# Patient Record
Sex: Female | Born: 1990 | Race: White | Hispanic: No | Marital: Single | Smoking: Never smoker
Health system: Southern US, Community
[De-identification: ages and names within clinical notes are randomized; demographics above are authoritative.]

## PROBLEM LIST (undated history)

## (undated) DIAGNOSIS — R030 Elevated blood-pressure reading, without diagnosis of hypertension: Secondary | ICD-10-CM

## (undated) DIAGNOSIS — IMO0001 Reserved for inherently not codable concepts without codable children: Secondary | ICD-10-CM

## (undated) HISTORY — DX: Elevated blood-pressure reading, without diagnosis of hypertension: R03.0

## (undated) HISTORY — DX: Reserved for inherently not codable concepts without codable children: IMO0001

---
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
## 2013-10-06 ENCOUNTER — Encounter: Payer: Self-pay | Admitting: *Deleted

## 2017-07-03 ENCOUNTER — Ambulatory Visit (INDEPENDENT_AMBULATORY_CARE_PROVIDER_SITE_OTHER): Payer: BLUE CROSS/BLUE SHIELD | Admitting: Family Medicine

## 2017-07-03 ENCOUNTER — Encounter: Payer: Self-pay | Admitting: Family Medicine

## 2017-07-03 VITALS — BP 152/110 | Temp 98.4°F | Ht 63.0 in | Wt 340.0 lb

## 2017-07-03 DIAGNOSIS — R030 Elevated blood-pressure reading, without diagnosis of hypertension: Secondary | ICD-10-CM | POA: Diagnosis not present

## 2017-07-03 DIAGNOSIS — R233 Spontaneous ecchymoses: Secondary | ICD-10-CM | POA: Diagnosis not present

## 2017-07-03 DIAGNOSIS — A084 Viral intestinal infection, unspecified: Secondary | ICD-10-CM

## 2017-07-03 DIAGNOSIS — R112 Nausea with vomiting, unspecified: Secondary | ICD-10-CM

## 2017-07-03 MED ORDER — ONDANSETRON HCL 8 MG PO TABS: 8.0000 mg | ORAL_TABLET | Freq: Three times a day (TID) | ORAL | 2 refills | Status: DC | PRN

## 2017-07-03 NOTE — Patient Instructions (Signed)
DASH Eating Plan DASH stands for "Dietary Approaches to Stop Hypertension." The DASH eating plan is a healthy eating plan that has been shown to reduce high blood pressure (hypertension). It may also reduce your risk for type 2 diabetes, heart disease, and stroke. The DASH eating plan may also help with weight loss. What are tips for following this plan? General guidelines  Avoid eating more than 2,300 mg (milligrams) of salt (sodium) a day. If you have hypertension, you may need to reduce your sodium intake to 1,500 mg a day.  Limit alcohol intake to no more than 1 drink a day for nonpregnant women and 2 drinks a day for men. One drink equals 12 oz of beer, 5 oz of wine, or 1 oz of hard liquor.  Work with your health care provider to maintain a healthy body weight or to lose weight. Ask what an ideal weight is for you.  Get at least 30 minutes of exercise that causes your heart to beat faster (aerobic exercise) most days of the week. Activities may include walking, swimming, or biking.  Work with your health care provider or diet and nutrition specialist (dietitian) to adjust your eating plan to your individual calorie needs. Reading food labels  Check food labels for the amount of sodium per serving. Choose foods with less than 5 percent of the Daily Value of sodium. Generally, foods with less than 300 mg of sodium per serving fit into this eating plan.  To find whole grains, look for the word "whole" as the first word in the ingredient list. Shopping  Buy products labeled as "low-sodium" or "no salt added."  Buy fresh foods. Avoid canned foods and premade or frozen meals. Cooking  Avoid adding salt when cooking. Use salt-free seasonings or herbs instead of table salt or sea salt. Check with your health care provider or pharmacist before using salt substitutes.  Do not fry foods. Cook foods using healthy methods such as baking, boiling, grilling, and broiling instead.  Cook with  heart-healthy oils, such as olive, canola, soybean, or sunflower oil. Meal planning   Eat a balanced diet that includes: ? 5 or more servings of fruits and vegetables each day. At each meal, try to fill half of your plate with fruits and vegetables. ? Up to 6-8 servings of whole grains each day. ? Less than 6 oz of lean meat, poultry, or fish each day. A 3-oz serving of meat is about the same size as a deck of cards. One egg equals 1 oz. ? 2 servings of low-fat dairy each day. ? A serving of nuts, seeds, or beans 5 times each week. ? Heart-healthy fats. Healthy fats called Omega-3 fatty acids are found in foods such as flaxseeds and coldwater fish, like sardines, salmon, and mackerel.  Limit how much you eat of the following: ? Canned or prepackaged foods. ? Food that is high in trans fat, such as fried foods. ? Food that is high in saturated fat, such as fatty meat. ? Sweets, desserts, sugary drinks, and other foods with added sugar. ? Full-fat dairy products.  Do not salt foods before eating.  Try to eat at least 2 vegetarian meals each week.  Eat more home-cooked food and less restaurant, buffet, and fast food.  When eating at a restaurant, ask that your food be prepared with less salt or no salt, if possible. What foods are recommended? The items listed may not be a complete list. Talk with your dietitian about what   dietary choices are best for you. Grains Whole-grain or whole-wheat bread. Whole-grain or whole-wheat pasta. Brown rice. Oatmeal. Quinoa. Bulgur. Whole-grain and low-sodium cereals. Pita bread. Low-fat, low-sodium crackers. Whole-wheat flour tortillas. Vegetables Fresh or frozen vegetables (raw, steamed, roasted, or grilled). Low-sodium or reduced-sodium tomato and vegetable juice. Low-sodium or reduced-sodium tomato sauce and tomato paste. Low-sodium or reduced-sodium canned vegetables. Fruits All fresh, dried, or frozen fruit. Canned fruit in natural juice (without  added sugar). Meat and other protein foods Skinless chicken or turkey. Ground chicken or turkey. Pork with fat trimmed off. Fish and seafood. Egg whites. Dried beans, peas, or lentils. Unsalted nuts, nut butters, and seeds. Unsalted canned beans. Lean cuts of beef with fat trimmed off. Low-sodium, lean deli meat. Dairy Low-fat (1%) or fat-free (skim) milk. Fat-free, low-fat, or reduced-fat cheeses. Nonfat, low-sodium ricotta or cottage cheese. Low-fat or nonfat yogurt. Low-fat, low-sodium cheese. Fats and oils Soft margarine without trans fats. Vegetable oil. Low-fat, reduced-fat, or light mayonnaise and salad dressings (reduced-sodium). Canola, safflower, olive, soybean, and sunflower oils. Avocado. Seasoning and other foods Herbs. Spices. Seasoning mixes without salt. Unsalted popcorn and pretzels. Fat-free sweets. What foods are not recommended? The items listed may not be a complete list. Talk with your dietitian about what dietary choices are best for you. Grains Baked goods made with fat, such as croissants, muffins, or some breads. Dry pasta or rice meal packs. Vegetables Creamed or fried vegetables. Vegetables in a cheese sauce. Regular canned vegetables (not low-sodium or reduced-sodium). Regular canned tomato sauce and paste (not low-sodium or reduced-sodium). Regular tomato and vegetable juice (not low-sodium or reduced-sodium). Pickles. Olives. Fruits Canned fruit in a light or heavy syrup. Fried fruit. Fruit in cream or butter sauce. Meat and other protein foods Fatty cuts of meat. Ribs. Fried meat. Bacon. Sausage. Bologna and other processed lunch meats. Salami. Fatback. Hotdogs. Bratwurst. Salted nuts and seeds. Canned beans with added salt. Canned or smoked fish. Whole eggs or egg yolks. Chicken or turkey with skin. Dairy Whole or 2% milk, cream, and half-and-half. Whole or full-fat cream cheese. Whole-fat or sweetened yogurt. Full-fat cheese. Nondairy creamers. Whipped toppings.  Processed cheese and cheese spreads. Fats and oils Butter. Stick margarine. Lard. Shortening. Ghee. Bacon fat. Tropical oils, such as coconut, palm kernel, or palm oil. Seasoning and other foods Salted popcorn and pretzels. Onion salt, garlic salt, seasoned salt, table salt, and sea salt. Worcestershire sauce. Tartar sauce. Barbecue sauce. Teriyaki sauce. Soy sauce, including reduced-sodium. Steak sauce. Canned and packaged gravies. Fish sauce. Oyster sauce. Cocktail sauce. Horseradish that you find on the shelf. Ketchup. Mustard. Meat flavorings and tenderizers. Bouillon cubes. Hot sauce and Tabasco sauce. Premade or packaged marinades. Premade or packaged taco seasonings. Relishes. Regular salad dressings. Where to find more information:  National Heart, Lung, and Blood Institute: www.nhlbi.nih.gov  American Heart Association: www.heart.org Summary  The DASH eating plan is a healthy eating plan that has been shown to reduce high blood pressure (hypertension). It may also reduce your risk for type 2 diabetes, heart disease, and stroke.  With the DASH eating plan, you should limit salt (sodium) intake to 2,300 mg a day. If you have hypertension, you may need to reduce your sodium intake to 1,500 mg a day.  When on the DASH eating plan, aim to eat more fresh fruits and vegetables, whole grains, lean proteins, low-fat dairy, and heart-healthy fats.  Work with your health care provider or diet and nutrition specialist (dietitian) to adjust your eating plan to your individual   calorie needs. This information is not intended to replace advice given to you by your health care provider. Make sure you discuss any questions you have with your health care provider. Document Released: 06/29/2011 Document Revised: 07/03/2016 Document Reviewed: 07/03/2016 Elsevier Interactive Patient Education  2017 [employer].  

## 2017-07-03 NOTE — Progress Notes (Signed)
   Subjective:    Patient ID: Latoya Huerta, female    DOB: 08/06/1990, 26 y.o.   MRN: [id number]  HPI Patient is here today for vomiting . She states she got up early this am and vomited after doing so she developed bruising around her eyes. She is still nauseous,but has not thrown up any more. Viral-like illness since early this morning.  Awoke at 2 AM with nausea and vomiting.  No diarrhea.  No fever sweats chills.  No head congestion drainage coughing or wheezing Did have nausea when she went to bed.  Review of Systems Please see above.  Negative for upper respiratory no runny nose cough wheezing positive for nausea and vomiting negative for diarrhea negative for bloody stools    Objective:   Physical Exam  Lungs are clear no crackles heart regular pulse normal abdomen soft no guarding rebound or tenderness blood pressure  elevated Ecchymosis around the eyes from the vomiting No other bruising noted    Assessment & Plan:  Elevated blood pressure- DASHdiet recommended along with weight loss regular physical activity follow-up within 4-6 weeks to recheck blood pressure  Screening lab work ordered Ecchymosis around the eyes I believe that this is related to her vomiting I do not believe that there is any type of underlying bleeding disorder.  No sign of any neurologic event  Viral syndrome Zofran for viral gastroenteritis supportive measures discussed follow-up if ongoing trouble

## 2017-07-23 DIAGNOSIS — A084 Viral intestinal infection, unspecified: Secondary | ICD-10-CM | POA: Diagnosis not present

## 2017-07-23 DIAGNOSIS — R233 Spontaneous ecchymoses: Secondary | ICD-10-CM | POA: Diagnosis not present

## 2017-07-23 DIAGNOSIS — R112 Nausea with vomiting, unspecified: Secondary | ICD-10-CM | POA: Diagnosis not present

## 2017-07-23 DIAGNOSIS — R030 Elevated blood-pressure reading, without diagnosis of hypertension: Secondary | ICD-10-CM | POA: Diagnosis not present

## 2017-07-24 LAB — CBC WITH DIFFERENTIAL/PLATELET
Basophils Absolute: 0 10*3/uL (ref 0.0–0.2)
Basos: 0 %
EOS (ABSOLUTE): 0.1 10*3/uL (ref 0.0–0.4)
Eos: 1 %
Hematocrit: 43.2 % (ref 34.0–46.6)
Hemoglobin: 15.2 g/dL (ref 11.1–15.9)
Immature Grans (Abs): 0 10*3/uL (ref 0.0–0.1)
Immature Granulocytes: 0 %
Lymphocytes Absolute: 3.2 10*3/uL — ABNORMAL HIGH (ref 0.7–3.1)
Lymphs: 39 %
MCH: 30.3 pg (ref 26.6–33.0)
MCHC: 35.2 g/dL (ref 31.5–35.7)
MCV: 86 fL (ref 79–97)
Monocytes Absolute: 0.7 10*3/uL (ref 0.1–0.9)
Monocytes: 8 %
Neutrophils Absolute: 4.3 10*3/uL (ref 1.4–7.0)
Neutrophils: 52 %
Platelets: 322 10*3/uL (ref 150–379)
RBC: 5.02 x10E6/uL (ref 3.77–5.28)
RDW: 13.6 % (ref 12.3–15.4)
WBC: 8.3 10*3/uL (ref 3.4–10.8)

## 2017-07-24 LAB — BASIC METABOLIC PANEL
BUN/Creatinine Ratio: 13 (ref 9–23)
BUN: 8 mg/dL (ref 6–20)
CO2: 25 mmol/L (ref 20–29)
Calcium: 10 mg/dL (ref 8.7–10.2)
Chloride: 100 mmol/L (ref 96–106)
Creatinine, Ser: 0.62 mg/dL (ref 0.57–1.00)
GFR calc Af Amer: 146 mL/min/{1.73_m2} (ref >59)
GFR calc non Af Amer: 127 mL/min/{1.73_m2} (ref >59)
Glucose: 86 mg/dL (ref 65–99)
Potassium: 4.7 mmol/L (ref 3.5–5.2)
Sodium: 141 mmol/L (ref 134–144)

## 2017-07-24 LAB — HEPATIC FUNCTION PANEL
ALT: 95 IU/L — ABNORMAL HIGH (ref 0–32)
AST: 56 IU/L — ABNORMAL HIGH (ref 0–40)
Albumin: 4.5 g/dL (ref 3.5–5.5)
Alkaline Phosphatase: 69 IU/L (ref 39–117)
Bilirubin Total: 0.5 mg/dL (ref 0.0–1.2)
Bilirubin, Direct: 0.12 mg/dL (ref 0.00–0.40)
Total Protein: 7.3 g/dL (ref 6.0–8.5)

## 2017-07-24 LAB — LIPID PANEL
Chol/HDL Ratio: 5.3 ratio — ABNORMAL HIGH (ref 0.0–4.4)
Cholesterol, Total: 189 mg/dL (ref 100–199)
HDL: 36 mg/dL — ABNORMAL LOW (ref >39)
LDL Calculated: 117 mg/dL — ABNORMAL HIGH (ref 0–99)
Triglycerides: 180 mg/dL — ABNORMAL HIGH (ref 0–149)
VLDL Cholesterol Cal: 36 mg/dL (ref 5–40)

## 2017-07-25 ENCOUNTER — Other Ambulatory Visit: Payer: Self-pay | Admitting: *Deleted

## 2017-07-25 DIAGNOSIS — R748 Abnormal levels of other serum enzymes: Secondary | ICD-10-CM

## 2017-07-31 ENCOUNTER — Encounter: Payer: Self-pay | Admitting: Family Medicine

## 2017-07-31 ENCOUNTER — Ambulatory Visit (HOSPITAL_COMMUNITY)
Admission: RE | Admit: 2017-07-31 | Discharge: 2017-07-31 | Disposition: A | Discharge status: Home / Self Care | Payer: BLUE CROSS/BLUE SHIELD | Source: Ambulatory Visit | Attending: Family Medicine | Admitting: Family Medicine

## 2017-07-31 ENCOUNTER — Ambulatory Visit (INDEPENDENT_AMBULATORY_CARE_PROVIDER_SITE_OTHER): Payer: BLUE CROSS/BLUE SHIELD | Admitting: Family Medicine

## 2017-07-31 VITALS — BP 134/96 | Ht 63.0 in | Wt 324.0 lb

## 2017-07-31 DIAGNOSIS — K760 Fatty (change of) liver, not elsewhere classified: Secondary | ICD-10-CM

## 2017-07-31 DIAGNOSIS — R748 Abnormal levels of other serum enzymes: Secondary | ICD-10-CM | POA: Insufficient documentation

## 2017-07-31 DIAGNOSIS — R740 Nonspecific elevation of levels of transaminase and lactic acid dehydrogenase [LDH]: Secondary | ICD-10-CM

## 2017-07-31 DIAGNOSIS — R932 Abnormal findings on diagnostic imaging of liver and biliary tract: Secondary | ICD-10-CM | POA: Insufficient documentation

## 2017-07-31 DIAGNOSIS — R7401 Elevation of levels of liver transaminase levels: Secondary | ICD-10-CM

## 2017-07-31 DIAGNOSIS — R030 Elevated blood-pressure reading, without diagnosis of hypertension: Secondary | ICD-10-CM | POA: Diagnosis not present

## 2017-07-31 MED ORDER — DESONIDE 0.05 % EX CREA: TOPICAL_CREAM | CUTANEOUS | 0 refills | Status: AC

## 2017-07-31 NOTE — Patient Instructions (Signed)
DASH Eating Plan DASH stands for "Dietary Approaches to Stop Hypertension." The DASH eating plan is a healthy eating plan that has been shown to reduce high blood pressure (hypertension). It may also reduce your risk for type 2 diabetes, heart disease, and stroke. The DASH eating plan may also help with weight loss. What are tips for following this plan? General guidelines  Avoid eating more than 2,300 mg (milligrams) of salt (sodium) a day. If you have hypertension, you may need to reduce your sodium intake to 1,500 mg a day.  Limit alcohol intake to no more than 1 drink a day for nonpregnant women and 2 drinks a day for men. One drink equals 12 oz of beer, 5 oz of wine, or 1 oz of hard liquor.  Work with your health care provider to maintain a healthy body weight or to lose weight. Ask what an ideal weight is for you.  Get at least 30 minutes of exercise that causes your heart to beat faster (aerobic exercise) most days of the week. Activities may include walking, swimming, or biking.  Work with your health care provider or diet and nutrition specialist (dietitian) to adjust your eating plan to your individual calorie needs. Reading food labels  Check food labels for the amount of sodium per serving. Choose foods with less than 5 percent of the Daily Value of sodium. Generally, foods with less than 300 mg of sodium per serving fit into this eating plan.  To find whole grains, look for the word "whole" as the first word in the ingredient list. Shopping  Buy products labeled as "low-sodium" or "no salt added."  Buy fresh foods. Avoid canned foods and premade or frozen meals. Cooking  Avoid adding salt when cooking. Use salt-free seasonings or herbs instead of table salt or sea salt. Check with your health care provider or pharmacist before using salt substitutes.  Do not fry foods. Cook foods using healthy methods such as baking, boiling, grilling, and broiling instead.  Cook with  heart-healthy oils, such as olive, canola, soybean, or sunflower oil. Meal planning   Eat a balanced diet that includes: ? 5 or more servings of fruits and vegetables each day. At each meal, try to fill half of your plate with fruits and vegetables. ? Up to 6-8 servings of whole grains each day. ? Less than 6 oz of lean meat, poultry, or fish each day. A 3-oz serving of meat is about the same size as a deck of cards. One egg equals 1 oz. ? 2 servings of low-fat dairy each day. ? A serving of nuts, seeds, or beans 5 times each week. ? Heart-healthy fats. Healthy fats called Omega-3 fatty acids are found in foods such as flaxseeds and coldwater fish, like sardines, salmon, and mackerel.  Limit how much you eat of the following: ? Canned or prepackaged foods. ? Food that is high in trans fat, such as fried foods. ? Food that is high in saturated fat, such as fatty meat. ? Sweets, desserts, sugary drinks, and other foods with added sugar. ? Full-fat dairy products.  Do not salt foods before eating.  Try to eat at least 2 vegetarian meals each week.  Eat more home-cooked food and less restaurant, buffet, and fast food.  When eating at a restaurant, ask that your food be prepared with less salt or no salt, if possible. What foods are recommended? The items listed may not be a complete list. Talk with your dietitian about what   dietary choices are best for you. Grains Whole-grain or whole-wheat bread. Whole-grain or whole-wheat pasta. Brown rice. Oatmeal. Quinoa. Bulgur. Whole-grain and low-sodium cereals. Pita bread. Low-fat, low-sodium crackers. Whole-wheat flour tortillas. Vegetables Fresh or frozen vegetables (raw, steamed, roasted, or grilled). Low-sodium or reduced-sodium tomato and vegetable juice. Low-sodium or reduced-sodium tomato sauce and tomato paste. Low-sodium or reduced-sodium canned vegetables. Fruits All fresh, dried, or frozen fruit. Canned fruit in natural juice (without  added sugar). Meat and other protein foods Skinless chicken or turkey. Ground chicken or turkey. Pork with fat trimmed off. Fish and seafood. Egg whites. Dried beans, peas, or lentils. Unsalted nuts, nut butters, and seeds. Unsalted canned beans. Lean cuts of beef with fat trimmed off. Low-sodium, lean deli meat. Dairy Low-fat (1%) or fat-free (skim) milk. Fat-free, low-fat, or reduced-fat cheeses. Nonfat, low-sodium ricotta or cottage cheese. Low-fat or nonfat yogurt. Low-fat, low-sodium cheese. Fats and oils Soft margarine without trans fats. Vegetable oil. Low-fat, reduced-fat, or light mayonnaise and salad dressings (reduced-sodium). Canola, safflower, olive, soybean, and sunflower oils. Avocado. Seasoning and other foods Herbs. Spices. Seasoning mixes without salt. Unsalted popcorn and pretzels. Fat-free sweets. What foods are not recommended? The items listed may not be a complete list. Talk with your dietitian about what dietary choices are best for you. Grains Baked goods made with fat, such as croissants, muffins, or some breads. Dry pasta or rice meal packs. Vegetables Creamed or fried vegetables. Vegetables in a cheese sauce. Regular canned vegetables (not low-sodium or reduced-sodium). Regular canned tomato sauce and paste (not low-sodium or reduced-sodium). Regular tomato and vegetable juice (not low-sodium or reduced-sodium). Pickles. Olives. Fruits Canned fruit in a light or heavy syrup. Fried fruit. Fruit in cream or butter sauce. Meat and other protein foods Fatty cuts of meat. Ribs. Fried meat. Bacon. Sausage. Bologna and other processed lunch meats. Salami. Fatback. Hotdogs. Bratwurst. Salted nuts and seeds. Canned beans with added salt. Canned or smoked fish. Whole eggs or egg yolks. Chicken or turkey with skin. Dairy Whole or 2% milk, cream, and half-and-half. Whole or full-fat cream cheese. Whole-fat or sweetened yogurt. Full-fat cheese. Nondairy creamers. Whipped toppings.  Processed cheese and cheese spreads. Fats and oils Butter. Stick margarine. Lard. Shortening. Ghee. Bacon fat. Tropical oils, such as coconut, palm kernel, or palm oil. Seasoning and other foods Salted popcorn and pretzels. Onion salt, garlic salt, seasoned salt, table salt, and sea salt. Worcestershire sauce. Tartar sauce. Barbecue sauce. Teriyaki sauce. Soy sauce, including reduced-sodium. Steak sauce. Canned and packaged gravies. Fish sauce. Oyster sauce. Cocktail sauce. Horseradish that you find on the shelf. Ketchup. Mustard. Meat flavorings and tenderizers. Bouillon cubes. Hot sauce and Tabasco sauce. Premade or packaged marinades. Premade or packaged taco seasonings. Relishes. Regular salad dressings. Where to find more information:  National Heart, Lung, and Blood Institute: www.nhlbi.nih.gov  American Heart Association: www.heart.org Summary  The DASH eating plan is a healthy eating plan that has been shown to reduce high blood pressure (hypertension). It may also reduce your risk for type 2 diabetes, heart disease, and stroke.  With the DASH eating plan, you should limit salt (sodium) intake to 2,300 mg a day. If you have hypertension, you may need to reduce your sodium intake to 1,500 mg a day.  When on the DASH eating plan, aim to eat more fresh fruits and vegetables, whole grains, lean proteins, low-fat dairy, and heart-healthy fats.  Work with your health care provider or diet and nutrition specialist (dietitian) to adjust your eating plan to your individual   calorie needs. This information is not intended to replace advice given to you by your health care provider. Make sure you discuss any questions you have with your health care provider. Document Released: 06/29/2011 Document Revised: 07/03/2016 Document Reviewed: 07/03/2016 Elsevier Interactive Patient Education  2018 [employer] Inc.  

## 2017-07-31 NOTE — Progress Notes (Signed)
   Subjective:    Patient ID: Latoya Huerta, female    DOB: 06/21/1991, 27 y.o.   MRN: [id number]  HPI Patient is here today for a follow up on her elevated bp. At last visit she was here for some bruising around her eye she state this has went away ,but the skin under her eyes are dry now. She is not sure if this is related. She is trying to watch her diet is trying to lose some weight she is trying to minimize salt in her diet she denies any chest tightness pressure pain or shortness of breath Review of Systems  Constitutional: Negative for activity change, fatigue and fever.  HENT: Negative for congestion.   Respiratory: Negative for cough, chest tightness and shortness of breath.   Cardiovascular: Negative for chest pain and leg swelling.  Gastrointestinal: Negative for abdominal pain.  Skin: Negative for color change.  Neurological: Negative for headaches.  Psychiatric/Behavioral: Negative for behavioral problems.       Objective:   Physical Exam  Constitutional: She appears well-developed and well-nourished. No distress.  HENT:  Head: Normocephalic and atraumatic.  Eyes: Right eye exhibits no discharge. Left eye exhibits no discharge.  Neck: No tracheal deviation present.  Cardiovascular: Normal rate, regular rhythm and normal heart sounds.  No murmur heard. Pulmonary/Chest: Effort normal and breath sounds normal. No respiratory distress. She has no wheezes. She has no rales.  Musculoskeletal: She exhibits no edema.  Lymphadenopathy:    She has no cervical adenopathy.  Neurological: She is alert. She exhibits normal muscle tone.  Skin: Skin is warm and dry. No erythema.  Psychiatric: Her behavior is normal.  Vitals reviewed.         Assessment & Plan:  Blood pressure is still mildly elevated but the patient is trying to lose some weight she is trying to exercise watch diet this is a reasonable first step of treating her elevated blood pressure dietary information was  given she will follow-up in several months to recheck blood pressure she will follow-up sooner if any symptoms such as chest pain headaches  Severe obesity patient was counseled to bring the weight down  Fatty liver ultrasound pending we will repeat liver enzymes again in 6-8 weeks if still moderately elevated will need other lab work to exclude other possibilities

## 2017-08-10 DIAGNOSIS — H1033 Unspecified acute conjunctivitis, bilateral: Secondary | ICD-10-CM | POA: Diagnosis not present

## 2017-12-24 ENCOUNTER — Ambulatory Visit: Payer: BLUE CROSS/BLUE SHIELD | Admitting: Family Medicine

## 2018-07-14 ENCOUNTER — Telehealth: Payer: BLUE CROSS/BLUE SHIELD | Admitting: Physician Assistant

## 2018-07-14 DIAGNOSIS — R6889 Other general symptoms and signs: Secondary | ICD-10-CM

## 2018-07-14 NOTE — Progress Notes (Signed)

## 2018-09-24 ENCOUNTER — Telehealth: Payer: BLUE CROSS/BLUE SHIELD | Admitting: Physician Assistant

## 2018-09-24 DIAGNOSIS — R69 Illness, unspecified: Secondary | ICD-10-CM

## 2018-09-24 DIAGNOSIS — J111 Influenza due to unidentified influenza virus with other respiratory manifestations: Secondary | ICD-10-CM

## 2018-09-24 MED ORDER — OSELTAMIVIR PHOSPHATE 75 MG PO CAPS: 75.0000 mg | ORAL_CAPSULE | Freq: Two times a day (BID) | ORAL | 0 refills | Status: AC

## 2018-09-24 NOTE — Progress Notes (Signed)
E visit for Flu like symptoms   We are sorry that you are not feeling well.  Here is how we plan to help! Based on what you have shared with me it looks like you may have a respiratory virus that may be influenza.  Influenza or "the flu" is   an infection caused by a respiratory virus. The flu virus is highly contagious and persons who did not receive their yearly flu vaccination may "catch" the flu from close contact.  We have anti-viral medications to treat the viruses that cause this infection. They are not a "cure" and only shorten the course of the infection. These prescriptions are most effective when they are given within the first 2 days of "flu" symptoms. Antiviral medication are indicated if you have a high risk of complications from the flu. You should also consider an antiviral medication if you are in close contact with someone who is at risk. These medications can help patients avoid complications from the flu  but have side effects that you should know. Possible side effects from Tamiflu or oseltamivir include nausea, vomiting, diarrhea, dizziness, headaches, eye redness, sleep problems or other respiratory symptoms. You should not take Tamiflu if you have an allergy to oseltamivir or any to the ingredients in Tamiflu.  Based upon your symptoms and potential risk factors I have prescribed Oseltamivir (Tamiflu).  It has been sent to your designated pharmacy.  You will take one 75 mg capsule orally twice a day for the next 5 days.  ANYONE WHO HAS FLU SYMPTOMS SHOULD: Stay home. The flu is highly contagious and going out or to work exposes others!  Be sure to drink plenty of fluids. Water is fine as well as fruit juices, sodas and electrolyte beverages. You may want to stay away from caffeine or alcohol. If you are nauseated, try taking small sips of liquids. How do you know if you are getting enough fluid? Your urine should be a pale yellow or almost colorless. Get rest. Taking a steamy  shower or using a humidifier may help nasal congestion and ease sore throat pain. Using a saline nasal spray works much the same way. Cough drops, hard candies and sore throat lozenges may ease your cough. Line up a caregiver. Have someone check on you regularly.   GET HELP RIGHT AWAY IF: You cannot keep down liquids or your medications. You become short of breath Your fell like you are going to pass out or loose consciousness. Your symptoms persist after you have completed your treatment plan MAKE SURE YOU  Understand these instructions. Will watch your condition. Will get help right away if you are not doing well or get worse.  Your e-visit answers were reviewed by a board certified advanced clinical practitioner to complete your personal care plan.  Depending on the condition, your plan could have included both over the counter or prescription medications.  If there is a problem please reply once you have received a response from your provider.  Your safety is important to [place].  If you have drug allergies check your prescription carefully.    You can use MyChart to ask questions about today's visit, request a non-urgent call back, or ask for a work or school excuse for 24 hours related to this e-Visit. If it has been greater than 24 hours you will need to follow up with your provider, or enter a new e-Visit to address those concerns.  You will get an e-mail in the next two  days asking about your experience.  I hope that your e-visit has been valuable and will speed your recovery. Thank you for using e-visits.   ===View-only below this line===   ----- Message -----    From: Sena Hitch    Sent: 09/24/2018  9:36 AM EST      To: E-Visit Mailing List Subject: E-Visit Submission: Flu Like Symptoms  E-Visit Submission: Flu Like Symptoms --------------------------------  Question: How long have you had flu like symptoms? Answer:   less than 48 hours  Question: Are you having any  body aches or pain such as Answer:   Muscle pain            Back pain            Arm pain            Leg pain  Question: Do you have a cough or sore throat? Answer:   I have a cough  Question: Are you in close contact with anyone who has similar symptoms ? Answer:   Yes  Question: Do you have a fever? Answer:   I don't know  Question: Are you short of breath? Answer:   Yes  Question: Do you have constant pain in your chest or abdomen? Answer:   No  Question: Are you able to keep liquids down? Answer:   Yes  Question: Have you experienced a seizure or loss of consciousness? Answer:   No  Question: Do you have a headache? Answer:   No  Question: Is there a rash? Answer:   No  Question: Are you over the age of 68? Answer:   No  Question: Are you treated for any of the following conditions: Asthma, COPD, diabetes, renal failure on dialysis, AIDS, any neuromuscular disease that effects the clearing of secretions heart failure or heart disease? Answer:   No  Question: Have you received recent chemotherapy or are you taking a drug that may reduce your ability to fight infections for psoriasis, arthritis, or any other autoimmune condition? Answer:   No  Question: Do you have close contact with anyone who has been diagnosed with any of the conditions listed above? Answer:   No  Question: Are you pregnant? Answer:   I am confident that I am not pregnant  Question: Are you breastfeeding? Answer:   No  Question: Are your symptoms severe enough that you think or someone has told you that you need to see a physician urgently? Answer:   No  Question: Are you allergic to Zanamivir or Oseltamivir (Tamiflu)? Answer:   No  Question: Are there children in your family or household that are under the age of 5? Answer:   No  Question: Please list your medication allergies that you may have ? (If 'none' , please list as 'none') Answer:   Sulfa  Question: Please list any  additional comments  Answer:     A total of 5-10 minutes was spent evaluating this patients questionnaire and formulating a plan of care.

## 2019-04-14 IMAGING — US US ABDOMEN LIMITED
1 series · 14 of 25 positions shown · non-contrast
Comparison: None.

CLINICAL DATA: Elevated liver enzymes

EXAM:
ULTRASOUND ABDOMEN LIMITED RIGHT UPPER QUADRANT

[Series 1: us abdomen limited · 0.28mm/px · 14 of 45 slices shown]
[im 1/45]
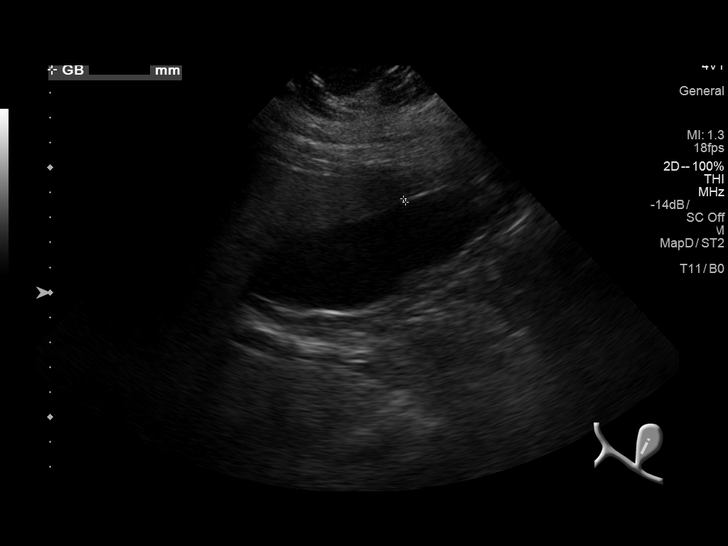
[im 4/45]
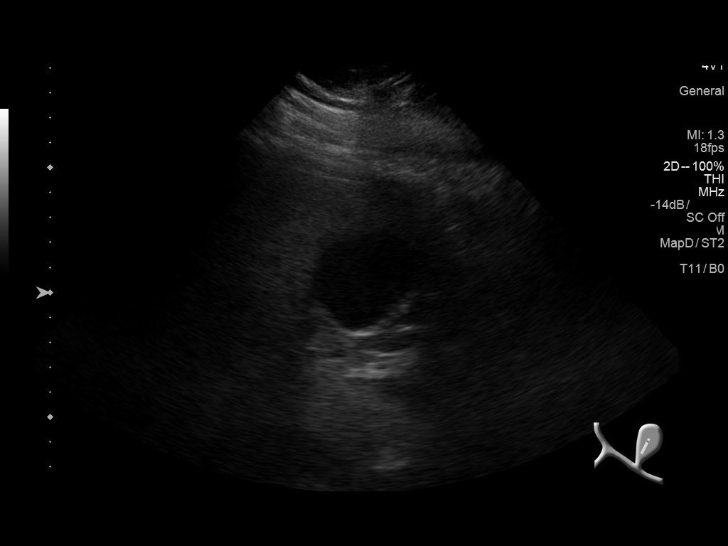
[im 8/45]
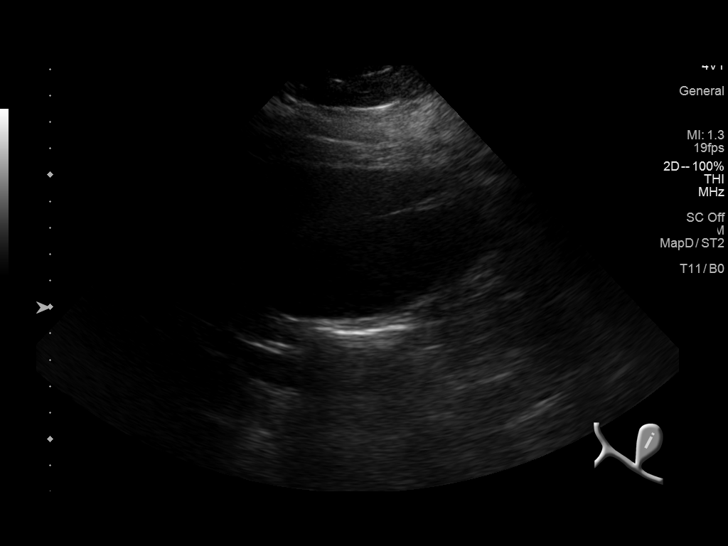
[im 12/45]
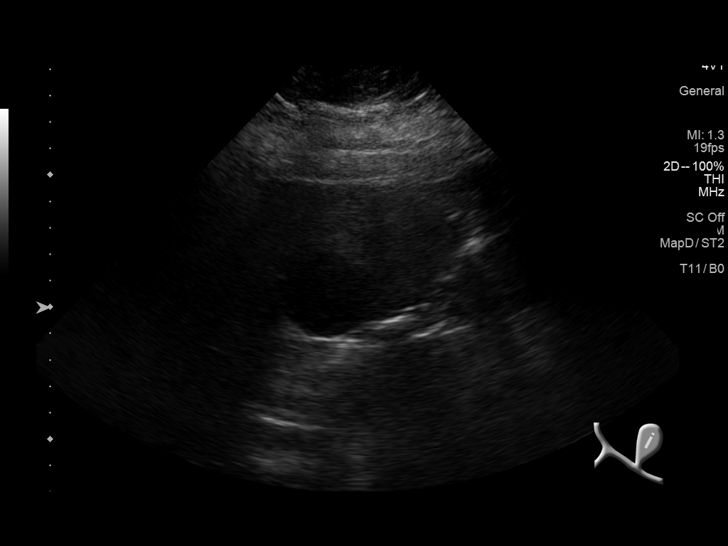
[im 15/45]
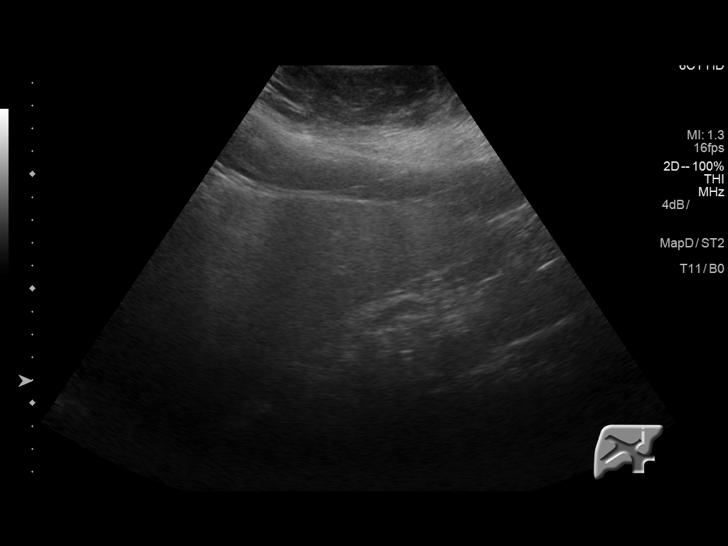
[im 17/45]
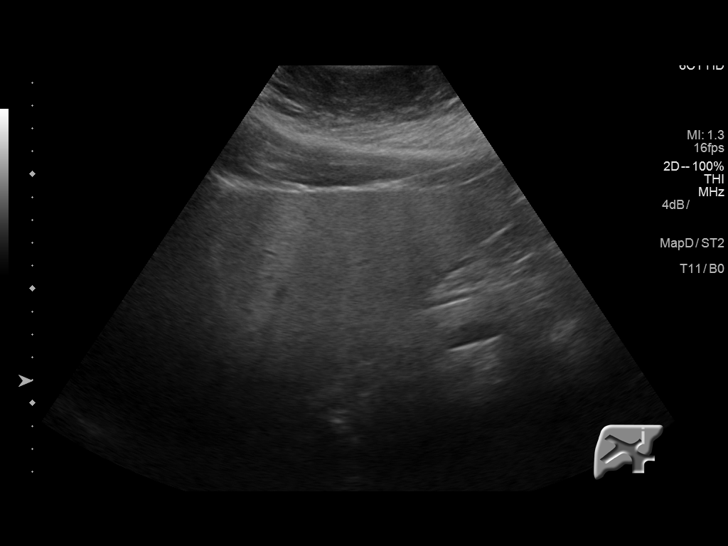
[im 21/45]
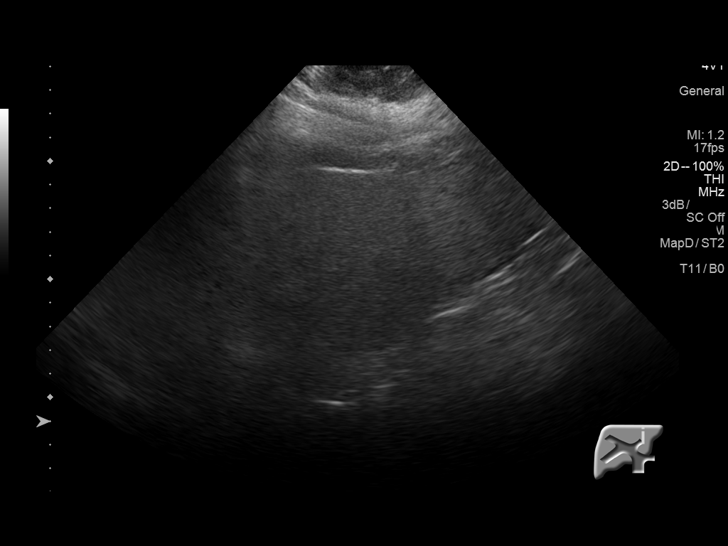
[im 24/45]
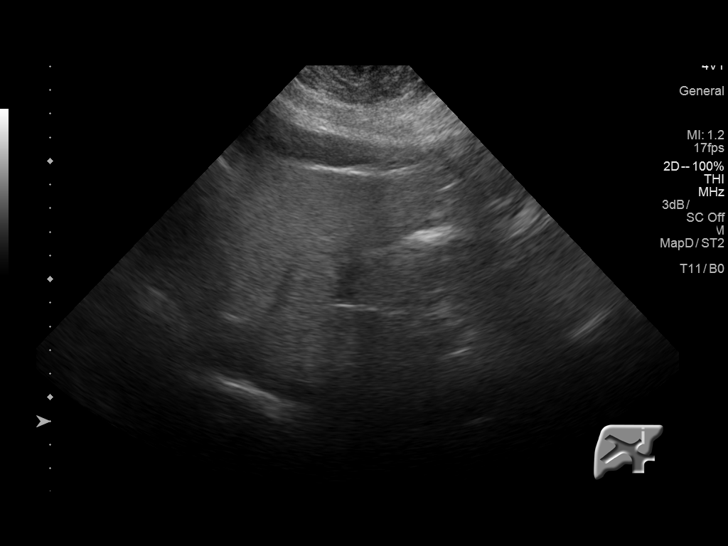
[im 28/45]
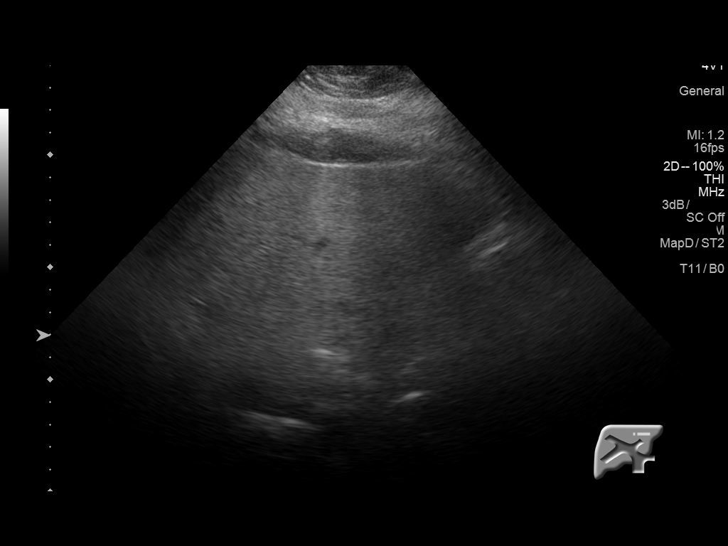
[im 30/45]
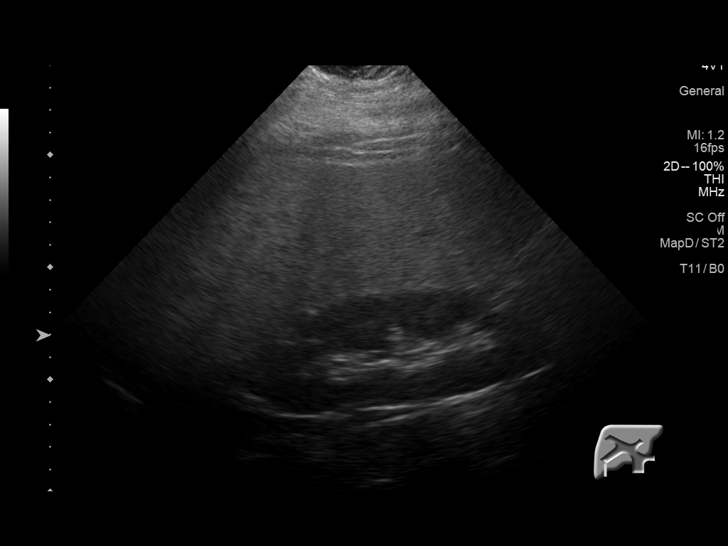
[im 34/45]
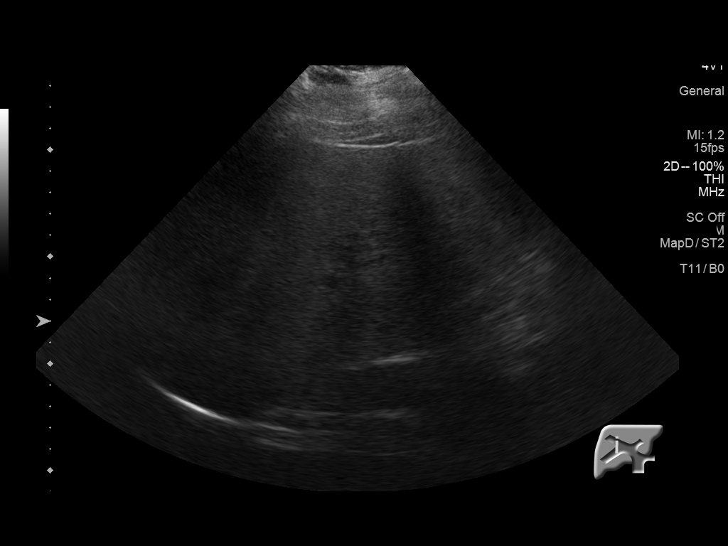
[im 37/45]
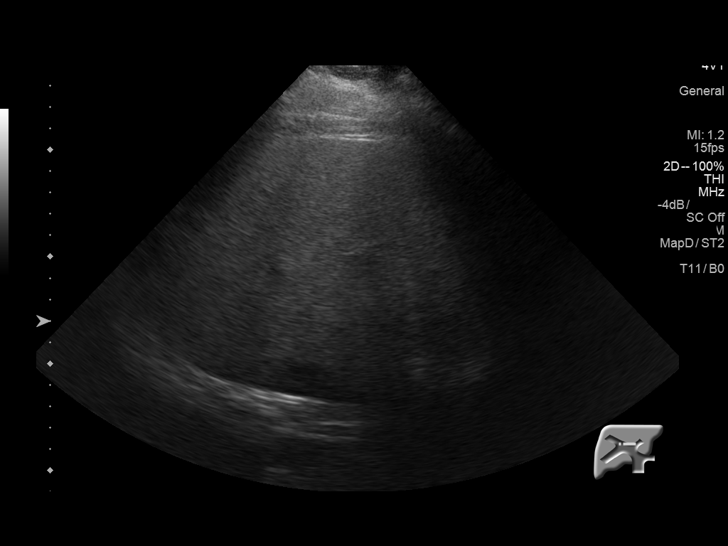
[im 41/45]
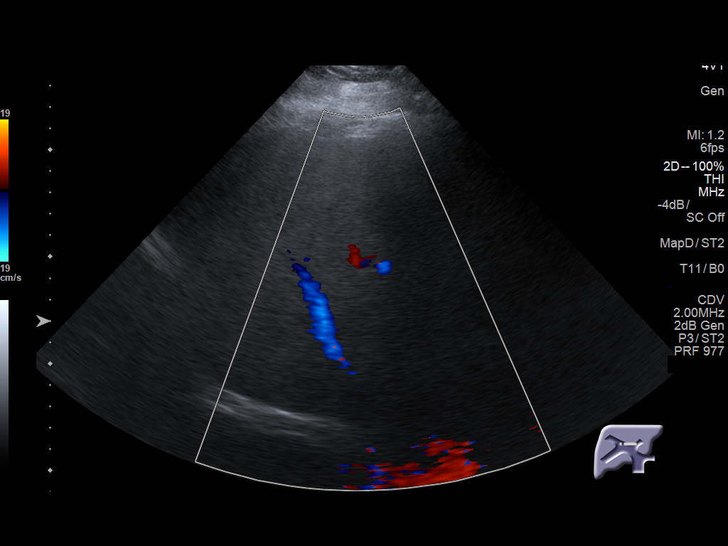
[im 45/45]
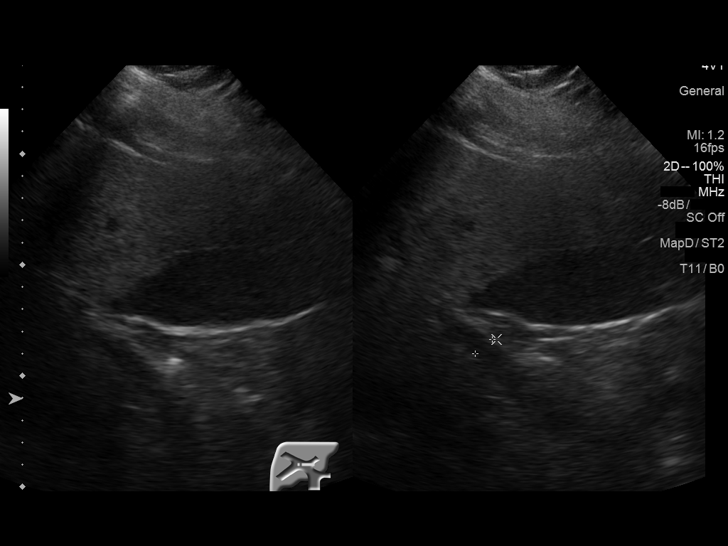

[14 of 25 positions shown; findings below may reference images not displayed]

FINDINGS: Gallbladder:

No gallstones or wall thickening visualized. There is no
pericholecystic fluid. No sonographic Murphy sign noted by
sonographer.

Common bile duct:

Diameter: 7 mm, upper normal. No biliary duct mass or calculus is
evident.

Liver:

No focal lesion identified. Liver echogenicity is increased. Portal
vein is patent on color Doppler imaging with normal direction of
blood flow towards the liver.
IMPRESSION: Increased liver echogenicity, a finding felt to be indicative of
hepatic steatosis. While no focal liver lesions are evident on this
study, it must be cautioned that the sensitivity of ultrasound for
detection of focal liver lesions is diminished in this circumstance.

Study otherwise unremarkable.

## 2020-07-28 DIAGNOSIS — Z20822 Contact with and (suspected) exposure to covid-19: Secondary | ICD-10-CM | POA: Diagnosis not present

## 2021-10-06 ENCOUNTER — Telehealth: Payer: BC Managed Care – PPO | Admitting: Physician Assistant

## 2021-10-06 DIAGNOSIS — J069 Acute upper respiratory infection, unspecified: Secondary | ICD-10-CM

## 2021-10-06 MED ORDER — BENZONATATE 100 MG PO CAPS: 100.0000 mg | ORAL_CAPSULE | Freq: Three times a day (TID) | ORAL | 0 refills | Status: AC | PRN

## 2021-10-06 MED ORDER — FLUTICASONE PROPIONATE 50 MCG/ACT NA SUSP: 2.0000 | Freq: Every day | NASAL | 0 refills | Status: AC

## 2021-10-06 NOTE — Progress Notes (Signed)
I have spent 5 minutes in review of e-visit questionnaire, review and updating patient chart, medical decision making and response to patient.   Dari Carpenito Cody Warner [place], PA-C    

## 2021-10-06 NOTE — Progress Notes (Signed)

## 2021-10-10 DIAGNOSIS — J069 Acute upper respiratory infection, unspecified: Secondary | ICD-10-CM | POA: Diagnosis not present

## 2022-06-26 DIAGNOSIS — J019 Acute sinusitis, unspecified: Secondary | ICD-10-CM | POA: Diagnosis not present

## 2022-06-27 DIAGNOSIS — H6640 Suppurative otitis media, unspecified, unspecified ear: Secondary | ICD-10-CM | POA: Diagnosis not present

## 2024-07-11 DIAGNOSIS — J019 Acute sinusitis, unspecified: Secondary | ICD-10-CM | POA: Diagnosis not present
